# Patient Record
Sex: Male | Born: 1990 | Race: Black or African American | Hispanic: No | Marital: Single | Smoking: Never smoker
Health system: Southern US, Community
[De-identification: ages and names within clinical notes are randomized; demographics above are authoritative.]

## PROBLEM LIST (undated history)

## (undated) ENCOUNTER — Emergency Department (HOSPITAL_COMMUNITY): Payer: Self-pay | Source: Home / Self Care

## (undated) ENCOUNTER — Emergency Department (HOSPITAL_COMMUNITY): Admission: EM | Payer: Self-pay | Source: Home / Self Care

## (undated) DIAGNOSIS — J45909 Unspecified asthma, uncomplicated: Secondary | ICD-10-CM

## (undated) HISTORY — PX: VASECTOMY: SHX75

---
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
## 2018-01-04 ENCOUNTER — Encounter (HOSPITAL_COMMUNITY): Payer: Self-pay | Admitting: Nurse Practitioner

## 2018-01-04 ENCOUNTER — Emergency Department (HOSPITAL_COMMUNITY)
Admission: EM | Admit: 2018-01-04 | Discharge: 2018-01-04 | Disposition: A | Discharge status: Home / Self Care | Payer: Self-pay | Attending: Emergency Medicine | Admitting: Emergency Medicine

## 2018-01-04 ENCOUNTER — Emergency Department (HOSPITAL_COMMUNITY): Payer: Self-pay

## 2018-01-04 DIAGNOSIS — L03818 Cellulitis of other sites: Secondary | ICD-10-CM | POA: Insufficient documentation

## 2018-01-04 LAB — CBC WITH DIFFERENTIAL/PLATELET
ABS IMMATURE GRANULOCYTES: 0.04 10*3/uL (ref 0.00–0.07)
Basophils Absolute: 0 10*3/uL (ref 0.0–0.1)
Basophils Relative: 0 %
Eosinophils Absolute: 0.1 10*3/uL (ref 0.0–0.5)
Eosinophils Relative: 1 %
HCT: 42.7 % (ref 39.0–52.0)
Hemoglobin: 13.5 g/dL (ref 13.0–17.0)
Immature Granulocytes: 0 %
Lymphocytes Relative: 22 %
Lymphs Abs: 2.4 10*3/uL (ref 0.7–4.0)
MCH: 29.3 pg (ref 26.0–34.0)
MCHC: 31.6 g/dL (ref 30.0–36.0)
MCV: 92.8 fL (ref 80.0–100.0)
MONO ABS: 1 10*3/uL (ref 0.1–1.0)
Monocytes Relative: 9 %
Neutro Abs: 7.2 10*3/uL (ref 1.7–7.7)
Neutrophils Relative %: 68 %
Platelets: 247 10*3/uL (ref 150–400)
RBC: 4.6 MIL/uL (ref 4.22–5.81)
RDW: 13.9 % (ref 11.5–15.5)
WBC: 10.7 10*3/uL — AB (ref 4.0–10.5)
nRBC: 0 % (ref 0.0–0.2)

## 2018-01-04 LAB — BASIC METABOLIC PANEL
Anion gap: 8 (ref 5–15)
BUN: 21 mg/dL — ABNORMAL HIGH (ref 6–20)
CHLORIDE: 106 mmol/L (ref 98–111)
CO2: 27 mmol/L (ref 22–32)
CREATININE: 1.26 mg/dL — AB (ref 0.61–1.24)
Calcium: 9.1 mg/dL (ref 8.9–10.3)
GFR calc Af Amer: >60 mL/min (ref >60)
GFR calc non Af Amer: >60 mL/min (ref >60)
Glucose, Bld: 93 mg/dL (ref 70–99)
POTASSIUM: 3.9 mmol/L (ref 3.5–5.1)
Sodium: 141 mmol/L (ref 135–145)

## 2018-01-04 MED ORDER — OXYCODONE-ACETAMINOPHEN 5-325 MG PO TABS
1.0000 | ORAL_TABLET | Freq: Once | ORAL | Status: AC
  Administered 2018-01-04: 1 via ORAL
  Filled 2018-01-04: qty 1

## 2018-01-04 MED ORDER — SULFAMETHOXAZOLE-TRIMETHOPRIM 800-160 MG PO TABS: 1.0000 | ORAL_TABLET | Freq: Two times a day (BID) | ORAL | 0 refills | Status: AC

## 2018-01-04 MED ORDER — CEPHALEXIN 500 MG PO CAPS: 500.0000 mg | ORAL_CAPSULE | Freq: Four times a day (QID) | ORAL | 0 refills | Status: DC

## 2018-01-04 MED ORDER — CEPHALEXIN 500 MG PO CAPS
500.0000 mg | ORAL_CAPSULE | Freq: Once | ORAL | Status: AC
  Administered 2018-01-04: 500 mg via ORAL
  Filled 2018-01-04: qty 1

## 2018-01-04 MED ORDER — HYDROCODONE-ACETAMINOPHEN 5-325 MG PO TABS: 1.0000 | ORAL_TABLET | ORAL | 0 refills | Status: DC | PRN

## 2018-01-04 MED ORDER — SULFAMETHOXAZOLE-TRIMETHOPRIM 800-160 MG PO TABS
1.0000 | ORAL_TABLET | Freq: Once | ORAL | Status: AC
  Administered 2018-01-04: 1 via ORAL
  Filled 2018-01-04: qty 1

## 2018-01-04 NOTE — ED Notes (Signed)
US at bedside

## 2018-01-04 NOTE — ED Provider Notes (Signed)
[facility]   CSN: [id number] Arrival date & time: 01/04/18  0014     History   Chief Complaint Chief Complaint  Patient presents with  . Abscess    HPI Mario Harvey is a 27 y.o. male.  The history is provided by the patient and medical records.    27 y.o. M here with abscess of his genital region.  States he started noticing this a week ago, seems to be getting worse.  States mostly localized area below his scrotum, he denies any significant sternal pain or swelling.  He has not noticed any drainage or bleeding.  No difficulty urinating.  Denies any fevers or chills.  States he also has a "boil" in his right axilla.  He has a history of boils in his axilla in the past but no issues in his genital region before.  He has not tried any medications or interventions at home PTA.  History reviewed. No pertinent past medical history.  Patient Active Problem List   Diagnosis Date Noted  . ECZEMA, ATOPIC DERMATITIS 03/20/2006    History reviewed. No pertinent surgical history.      Home Medications    Prior to Admission medications   Not on File    Family History No family history on file.  Social History Social History   Tobacco Use  . Smoking status: Not on file  Substance Use Topics  . Alcohol use: Yes  . Drug use: Not on file     Allergies   Patient has no known allergies.   Review of Systems Review of Systems  Genitourinary:       Perineal abscess  All other systems reviewed and are negative.    Physical Exam Updated Vital Signs BP 132/86 (BP Location: Right Arm)   Pulse 84   Temp 98.9 F (37.2 C) (Oral)   Resp 14   SpO2 95%   Physical Exam Vitals signs and nursing note reviewed.  Constitutional:      Appearance: He is well-developed.  HENT:     Head: Normocephalic and atraumatic.  Eyes:     Conjunctiva/sclera: Conjunctivae normal.     Pupils: Pupils are equal, round, and reactive to  light.  Neck:     Musculoskeletal: Normal range of motion.  Cardiovascular:     Rate and Rhythm: Normal rate and regular rhythm.     Heart sounds: Normal heart sounds.  Pulmonary:     Effort: Pulmonary effort is normal.     Breath sounds: Normal breath sounds. No stridor. No rhonchi.  Abdominal:     General: Bowel sounds are normal.     Palpations: Abdomen is soft. There is no mass.     Hernia: No hernia is present.  Genitourinary:    Comments: No significant scrotal swelling/tenderness, no palpable mass; perineum is firm to the touch and tender; no fluctuance or abscess noted; there is no drainage/bleeding; no areas of skin necrosis appreciated, no tissue crepitus Musculoskeletal: Normal range of motion.  Skin:    General: Skin is warm and dry.  Neurological:     Mental Status: He is alert and oriented to person, place, and time.      ED Treatments / Results  Labs (all labs ordered are listed, but only abnormal results are displayed) Labs Reviewed - No data to display  EKG None  Radiology [place] Scrotum  Result Date: 01/04/2018 CLINICAL DATA:  Perineum swelling. EXAM: ULTRASOUND OF SCROTUM TECHNIQUE: Complete ultrasound examination  of the testicles, epididymis, and other scrotal structures was performed. COMPARISON:  None. FINDINGS: Right testicle Measurements: 4 x 1.9 x 3.5 cm. No mass or microlithiasis visualized. Left testicle Measurements: 3.5 x 2.1 x 3.6 cm. No mass or microlithiasis visualized. Right epididymis: Normal in size and appearance. 2.1 x 0.9 x 0.8 cm anechoic simple cyst with increased through transmission consistent with simple cyst. Left epididymis:  Normal in size and appearance. Hydrocele: Minimal LEFT hydrocele is likely physiologic. Varicocele:  None visualized. Dedicated sonogram of perineum does not demonstrate focal fluid or abnormal vascularity. IMPRESSION: 1. No acute process. 2. 2.1 cm RIGHT epididymal simple cyst. Electronically Signed   By: Awilda Metro M.D.   On: 01/04/2018 03:32    Procedures Procedures (including critical care time)  Medications Ordered in ED Medications - No data to display   Initial Impression / Assessment and Plan / ED Course  I have reviewed the triage vital signs and the nursing notes.  Pertinent labs & imaging results that were available during my care of the patient were reviewed by me and considered in my medical decision making (see chart for details).  27 year old male here with concern of "boil" in his genital region.  States he first noticed this about a week ago and seems like it is getting bigger.  He is afebrile and nontoxic in appearance.  He does not have any scrotal swelling or tenderness, no palpable mass.  His perineum along the right side is firm to the touch and tender but there is no appreciable fluctuance or abscess noted.  There is no area of skin necrosis, no tissue crepitus.  Given location, will obtain labs and ultrasound.    Labs are overall reassuring.  Ultrasound was done of the scrotum extending down into the perineum-- there is no abscess or other drainable fluid collection noted.  This likely represents cellulitis.  He does not have any signs or symptoms suggestive of Fournier's gangrene at this time.  Case discussed with attending physician, Dr. Lynelle Doctor-- recommends warm compresses/soaks, start bactrim/keflex.  Recommended close follow-up with urology for re-check.  Results and plan discussed with patient, he acknowledged understanding.  He will return here for any new/acute changes.  Final Clinical Impressions(s) / ED Diagnoses   Final diagnoses:  Cellulitis of other specified site    ED Discharge Orders         Ordered    sulfamethoxazole-trimethoprim (BACTRIM DS,SEPTRA DS) 800-160 MG tablet  2 times daily     01/04/18 0545    cephALEXin (KEFLEX) 500 MG capsule  4 times daily     01/04/18 0545    HYDROcodone-acetaminophen (NORCO/VICODIN) 5-325 MG tablet  Every 4 hours PRN      01/04/18 0545           Garlon Hatchet, PA-C 01/04/18 4098    Devoria Albe, MD 01/04/18 [phone]

## 2018-01-04 NOTE — Discharge Instructions (Signed)
Take the prescribed medication as directed.  Recommend warm compresses to the genital area. Follow-up with urology-- call for appt. Return to the ED for new or worsening symptoms-- worsening pain, high fever, drainage, testicular swelling, etc.

## 2018-01-04 NOTE — ED Triage Notes (Signed)
Pt is reporting an abscess/boil in his testicular area. Denies fever or chills.

## 2018-02-11 ENCOUNTER — Encounter (HOSPITAL_COMMUNITY): Payer: Self-pay | Admitting: Emergency Medicine

## 2018-02-11 ENCOUNTER — Ambulatory Visit (HOSPITAL_COMMUNITY)
Admission: EM | Admit: 2018-02-11 | Discharge: 2018-02-11 | Disposition: A | Discharge status: Home / Self Care | Payer: Self-pay | Attending: Family Medicine | Admitting: Family Medicine

## 2018-02-11 DIAGNOSIS — L03115 Cellulitis of right lower limb: Secondary | ICD-10-CM

## 2018-02-11 DIAGNOSIS — L02415 Cutaneous abscess of right lower limb: Secondary | ICD-10-CM

## 2018-02-11 DIAGNOSIS — L988 Other specified disorders of the skin and subcutaneous tissue: Secondary | ICD-10-CM

## 2018-02-11 LAB — GLUCOSE, CAPILLARY: GLUCOSE-CAPILLARY: 92 mg/dL (ref 70–99)

## 2018-02-11 MED ORDER — CEFTRIAXONE SODIUM 1 G IJ SOLR
INTRAMUSCULAR | Status: AC
  Filled 2018-02-11: qty 10

## 2018-02-11 MED ORDER — HYDROCODONE-ACETAMINOPHEN 5-325 MG PO TABS: 1.0000 | ORAL_TABLET | Freq: Four times a day (QID) | ORAL | 0 refills | Status: DC | PRN

## 2018-02-11 MED ORDER — SULFAMETHOXAZOLE-TRIMETHOPRIM 800-160 MG PO TABS: 1.0000 | ORAL_TABLET | Freq: Two times a day (BID) | ORAL | 0 refills | Status: AC

## 2018-02-11 MED ORDER — CEFTRIAXONE SODIUM 1 G IJ SOLR
1.0000 g | Freq: Once | INTRAMUSCULAR | Status: AC
  Administered 2018-02-11: 1 g via INTRAMUSCULAR

## 2018-02-11 NOTE — Discharge Instructions (Addendum)
You have had an abscess drained today. You may shower daily. Let the soapy water clean your wound. Do not scrub it. Keep your wound covered. Follow up at the Urgent Care in 2 days for a wound check. Return to the Urgent Care immediately if you develop any of the following symptoms: fever, Increased redness or swelling around where your abscess was, increased pain, or generalized weakness or vomiting.  Be aware, pain medications may cause drowsiness. Please do not drive, operate heavy machinery or make important decisions while on this medication, it can cloud your judgement.  You were given the following medication here this evening: Meds ordered this encounter  Medications   cefTRIAXone (ROCEPHIN) injection 1 g

## 2018-02-11 NOTE — ED Provider Notes (Signed)
[facility]   [id number] 02/11/18 Arrival Time: 1517  ASSESSMENT & PLAN:  1. Abscess of knee, right   2. Cellulitis of leg, right     Incision and Drainage Procedure Note  Anesthesia: 1% lidocaine with epinephrine Procedure Details  The procedure, risks and complications have been discussed in detail (including, but not limited to pain and bleeding) with the patient.  The skin induration was prepped and draped in the usual fashion. After adequate local anesthesia, I&D with a #11 blade was performed on the right anterior knee with moderate purlulent drainage.  EBL: minimal Drains: none Packing: none Condition: Tolerated procedure well Complications: none.  Meds ordered this encounter  Medications  . cefTRIAXone (ROCEPHIN) injection 1 g  . sulfamethoxazole-trimethoprim (BACTRIM DS,SEPTRA DS) 800-160 MG tablet    Sig: Take 1 tablet by mouth 2 (two) times daily for 10 days.    Dispense:  20 tablet    Refill:  0  . HYDROcodone-acetaminophen (NORCO/VICODIN) 5-325 MG tablet    Sig: Take 1 tablet by mouth every 6 (six) hours as needed for moderate pain or severe pain.    Dispense:  8 tablet    Refill:  0    Wound care instructions discussed and given in written format. To return in 48 hours for wound check unless he feels the erythema from cellulitis is rapidly spreading in which case he understands to proceed to the ED for evaluation.  [place] Controlled Substances Registry consulted for this patient. I feel the risk/benefit ratio today is favorable for proceeding with this prescription for a controlled substance. Medication sedation precautions given.  Finish all antibiotics. OTC analgesics as needed.  Reviewed expectations re: course of current medical issues. Questions answered. Outlined signs and symptoms indicating need for more acute intervention. Patient verbalized understanding. After Visit Summary given.   SUBJECTIVE: History from: patient. Mario Harvey is  a 28 y.o. male who presents with a possible infection of his R knee and leg. Onset gradual, approximately several days ago with mild drainage today and without active bleeding. Symptoms have gradually worsened since beginning. Increasing erythema around his knee spreading proximally and distally. Fever: questions subjective with chills last night. Kneels a lot at work. Area around knee started out "as a small bump"; has been picking at it on and off. OTC/home treatment: none reported.  ROS: As per HPI.  OBJECTIVE:  Vitals:   02/11/18 1628  BP: 137/60  Pulse: (!) 106  Resp: 18  Temp: 98.3 F (36.8 C)  TempSrc: Oral  SpO2: 96%     General appearance: alert; no distress Skin: 1.5 cm induration of his R anterior knee; tender to touch; no active drainage or bleeding; significant erythema spreading proximally to mid thigh and distally to mid calf; warm to touch RLE: no calf swelling; 2+ DP and PT pulses; FROM of R knee and R ankle Psychological: alert and cooperative; normal mood and affect  No Known Allergies  PMH: Similar skin infection approx one month ago. Went away without treatment.  Social History   Socioeconomic History  . Marital status: Single    Spouse name: Not on file  . Number of children: Not on file  . Years of education: Not on file  . Highest education level: Not on file  Occupational History  . Not on file  Social Needs  . Financial resource strain: Not on file  . Food insecurity:    Worry: Not on file    Inability: Not on file  .  Transportation needs:    Medical: Not on file    Non-medical: Not on file  Tobacco Use  . Smoking status: Never Smoker  . Smokeless tobacco: Never Used  Substance and Sexual Activity  . Alcohol use: Yes  . Drug use: Not on file  . Sexual activity: Not on file  Lifestyle  . Physical activity:    Days per week: Not on file    Minutes per session: Not on file  . Stress: Not on file  Relationships  . Social connections:     Talks on phone: Not on file    Gets together: Not on file    Attends religious service: Not on file    Active member of club or organization: Not on file    Attends meetings of clubs or organizations: Not on file    Relationship status: Not on file  Other Topics Concern  . Not on file  Social History Narrative  . Not on file    History reviewed. No pertinent surgical history.         Mardella Layman, MD 02/12/18 [phone]

## 2018-02-11 NOTE — ED Triage Notes (Signed)
Pt presents to [facility] for assessment of right knee pain, scraped his leg on something 2-3 weeks ago, and now notes discharge from area

## 2018-05-11 ENCOUNTER — Ambulatory Visit (HOSPITAL_COMMUNITY)
Admission: EM | Admit: 2018-05-11 | Discharge: 2018-05-11 | Disposition: A | Discharge status: Home / Self Care | Payer: Self-pay | Attending: Family Medicine | Admitting: Family Medicine

## 2018-05-11 ENCOUNTER — Other Ambulatory Visit: Payer: Self-pay

## 2018-05-11 ENCOUNTER — Encounter (HOSPITAL_COMMUNITY): Payer: Self-pay | Admitting: Emergency Medicine

## 2018-05-11 DIAGNOSIS — L0231 Cutaneous abscess of buttock: Secondary | ICD-10-CM

## 2018-05-11 MED ORDER — CEPHALEXIN 500 MG PO CAPS: 500.0000 mg | ORAL_CAPSULE | Freq: Four times a day (QID) | ORAL | 0 refills | Status: DC

## 2018-05-11 MED ORDER — MUPIROCIN 2 % EX OINT: 1.0000 "application " | TOPICAL_OINTMENT | Freq: Two times a day (BID) | CUTANEOUS | 0 refills | Status: DC

## 2018-05-11 NOTE — ED Notes (Signed)
Patient verbalizes understanding of discharge instructions. Opportunity for questioning and answers were provided. Patient discharged from [facility] by RN.  

## 2018-05-11 NOTE — ED Provider Notes (Signed)
[facility]    CSN: [id number] Arrival date & time: 05/11/18  1252     History   Chief Complaint Chief Complaint  Patient presents with  . Abscess    HPI Mario Harvey is a 28 y.o. male.   28 year old male comes in for abscess to the right buttock x 3 days. States has been squeezing area without obvious relief. Denies increase in size, spreading erythema, warmth, fever. States pain mostly with sitting due to location of abscess, but if without pressure to the abscess, no pain. Has not taken anything for the symptoms.      History reviewed. No pertinent past medical history.  Patient Active Problem List   Diagnosis Date Noted  . ECZEMA, ATOPIC DERMATITIS 03/20/2006    History reviewed. No pertinent surgical history.     Home Medications    Prior to Admission medications   Medication Sig Start Date End Date Taking? Authorizing Provider  acetaminophen (TYLENOL) 500 MG tablet Take 1,000 mg by mouth every 6 (six) hours as needed for mild pain, moderate pain or headache.    [provider]  cephALEXin (KEFLEX) 500 MG capsule Take 1 capsule (500 mg total) by mouth 4 (four) times daily. 05/11/18   Belinda Fisher, PA-C  HYDROcodone-acetaminophen (NORCO/VICODIN) 5-325 MG tablet Take 1 tablet by mouth every 6 (six) hours as needed for moderate pain or severe pain. Patient not taking: Reported on 05/11/2018 02/11/18   Mardella Layman, MD  mupirocin ointment (BACTROBAN) 2 % Apply 1 application topically 2 (two) times daily. 05/11/18   Belinda Fisher, PA-C    Family History No family history on file.  Social History Social History   Tobacco Use  . Smoking status: Never Smoker  . Smokeless tobacco: Never Used  Substance Use Topics  . Alcohol use: Yes  . Drug use: Not on file     Allergies   Patient has no known allergies.   Review of Systems Review of Systems  Reason unable to perform ROS: See HPI as above.     Physical Exam Triage Vital Signs ED Triage  Vitals  Enc Vitals Group     BP 05/11/18 1300 114/73     Pulse Rate 05/11/18 1300 69     Resp 05/11/18 1300 16     Temp 05/11/18 1300 98.2 F (36.8 C)     Temp src --      SpO2 05/11/18 1300 100 %     Weight --      Height --      Head Circumference --      Peak Flow --      Pain Score 05/11/18 1303 6     Pain Loc --      Pain Edu? --      Excl. in GC? --    No data found.  Updated Vital Signs BP 114/73   Pulse 69   Temp 98.2 F (36.8 C)   Resp 16   SpO2 100%   Physical Exam Constitutional:      General: He is not in acute distress.    Appearance: He is well-developed. He is not diaphoretic.  HENT:     Head: Normocephalic and atraumatic.  Eyes:     Conjunctiva/sclera: Conjunctivae normal.     Pupils: Pupils are equal, round, and reactive to light.  Skin:    Comments: 1cm x 0.5cm abscess to the right inferior buttock, adjacent to the thigh. Surrounding induration with mild warmth  and erythema.   Neurological:     Mental Status: He is alert and oriented to person, place, and time.      UC Treatments / Results  Labs (all labs ordered are listed, but only abnormal results are displayed) Labs Reviewed - No data to display  EKG None  Radiology No results found.  Procedures Incision and Drainage Date/Time: 05/11/2018 1:44 PM Performed by: Belinda Fisher,  V, PA-C Authorized by: Myra RudeSchmitz, Jeremy E, MD   Consent:    Consent obtained:  Verbal   Consent given by:  Patient   Risks discussed:  Bleeding, incomplete drainage, pain, damage to other organs and infection   Alternatives discussed:  Alternative treatment Location:    Type:  Abscess   Size:  1cm x 0.5cm   Location:  Lower extremity   Lower extremity location:  Buttock   Buttock location:  R buttock Pre-procedure details:    Skin preparation:  Chloraprep Anesthesia (see MAR for exact dosages):    Anesthesia method:  Local infiltration   Local anesthetic:  Lidocaine 2% w/o epi Procedure type:     Complexity:  Simple Procedure details:    Needle aspiration: no     Incision types:  Single straight   Incision depth:  Dermal   Scalpel blade:  11   Wound management:  Irrigated with saline   Drainage:  Purulent   Drainage amount:  Moderate   Wound treatment:  Wound left open   Packing materials:  None Post-procedure details:    Patient tolerance of procedure:  Tolerated well, no immediate complications   (including critical care time)  Medications Ordered in UC Medications - No data to display  Initial Impression / Assessment and Plan / UC Course  I have reviewed the triage vital signs and the nursing notes.  Pertinent labs & imaging results that were available during my care of the patient were reviewed by me and considered in my medical decision making (see chart for details).    Patient tolerated procedure well. Start keflex for surrounding cellulitis. Wound care instructions given. Return precautions given. Patient expresses understanding and agrees to plan.   Final Clinical Impressions(s) / UC Diagnoses   Final diagnoses:  Abscess of buttock, right    ED Prescriptions    Medication Sig Dispense Auth. Provider   mupirocin ointment (BACTROBAN) 2 % Apply 1 application topically 2 (two) times daily. 22 g ,  V, PA-C   cephALEXin (KEFLEX) 500 MG capsule Take 1 capsule (500 mg total) by mouth 4 (four) times daily. 28 capsule Threasa Alpha,  V, PA-C        ,  V, [place]PA-C 05/11/18 1346

## 2018-05-11 NOTE — ED Triage Notes (Signed)
Pt states he has an abscess or boil on his buttocks x3 days.

## 2018-05-11 NOTE — Discharge Instructions (Signed)
Start keflex as directed. You can remove current dressing in 24 hours. Keep wound clean and dry. You can clean gently with soap and water. Do not soak area in water. Dress wound with bactroban ointment. Warm compress to the area. Monitor for spreading redness, increased warmth, increased swelling, fever, follow up for reevaluation needed.

## 2018-12-18 ENCOUNTER — Encounter (HOSPITAL_COMMUNITY): Payer: Self-pay | Admitting: *Deleted

## 2018-12-18 ENCOUNTER — Other Ambulatory Visit: Payer: Self-pay

## 2018-12-18 ENCOUNTER — Ambulatory Visit (HOSPITAL_COMMUNITY)
Admission: EM | Admit: 2018-12-18 | Discharge: 2018-12-18 | Disposition: A | Discharge status: Home / Self Care | Payer: Self-pay | Attending: Family Medicine | Admitting: Family Medicine

## 2018-12-18 DIAGNOSIS — Z8619 Personal history of other infectious and parasitic diseases: Secondary | ICD-10-CM

## 2018-12-18 DIAGNOSIS — Z202 Contact with and (suspected) exposure to infections with a predominantly sexual mode of transmission: Secondary | ICD-10-CM | POA: Insufficient documentation

## 2018-12-18 MED ORDER — AZITHROMYCIN 250 MG PO TABS
1000.0000 mg | ORAL_TABLET | Freq: Once | ORAL | Status: AC
  Administered 2018-12-18: 16:00:00 1000 mg via ORAL

## 2018-12-18 MED ORDER — AZITHROMYCIN 250 MG PO TABS
ORAL_TABLET | ORAL | Status: AC
  Filled 2018-12-18: qty 4

## 2018-12-18 NOTE — ED Triage Notes (Addendum)
Patient reports being exposed to chlamydia, no symptoms. Patient would like treatment.

## 2018-12-18 NOTE — Discharge Instructions (Signed)
We have treated you today for chlamydia  Will notify of any positive findings from your penile swab and if any changes to treatment are needed.   Please withhold from intercourse for the next week. Please use condoms to prevent STD's.

## 2018-12-18 NOTE — ED Provider Notes (Signed)
[facility]    CSN: [id number] Arrival date & time: 12/18/18  1514      History   Chief Complaint Chief Complaint  Patient presents with  . Exposure to STD    HPI Mario Harvey is a 28 y.o. male.   Mario Harvey presents with complaints of exposure to chlamydia. His partner notified him that she tested positive for chlamydia this week. He denies  Any symptoms. Denies any dysuria, penile discharge, pelvic pain, urinary symptoms, back pain, sores, lesions, redness or swelling. States he has had chlamydia in the past.     ROS per HPI, negative if not otherwise mentioned.      History reviewed. No pertinent past medical history.  Patient Active Problem List   Diagnosis Date Noted  . ECZEMA, ATOPIC DERMATITIS 03/20/2006    History reviewed. No pertinent surgical history.     Home Medications    Prior to Admission medications   Medication Sig Start Date End Date Taking? Authorizing Provider  acetaminophen (TYLENOL) 500 MG tablet Take 1,000 mg by mouth every 6 (six) hours as needed for mild pain, moderate pain or headache.    [provider]  cephALEXin (KEFLEX) 500 MG capsule Take 1 capsule (500 mg total) by mouth 4 (four) times daily. 05/11/18   Belinda Fisher, PA-C  HYDROcodone-acetaminophen (NORCO/VICODIN) 5-325 MG tablet Take 1 tablet by mouth every 6 (six) hours as needed for moderate pain or severe pain. Patient not taking: Reported on 05/11/2018 02/11/18   Mardella Layman, MD  mupirocin ointment (BACTROBAN) 2 % Apply 1 application topically 2 (two) times daily. 05/11/18   Belinda Fisher, PA-C    Family History Family History  Problem Relation Age of Onset  . Diabetes Father     Social History Social History   Tobacco Use  . Smoking status: Never Smoker  . Smokeless tobacco: Never Used  Substance Use Topics  . Alcohol use: Yes  . Drug use: Not on file     Allergies   Patient has no known allergies.   Review of Systems Review of Systems   Physical Exam Triage Vital Signs ED Triage Vitals  Enc Vitals Group     BP 12/18/18 1545 121/78     Pulse Rate 12/18/18 1545 (!) 58     Resp 12/18/18 1545 17     Temp 12/18/18 1545 98.1 F (36.7 C)     Temp Source 12/18/18 1545 Oral     SpO2 12/18/18 1545 98 %     Weight --      Height --      Head Circumference --      Peak Flow --      Pain Score 12/18/18 1544 0     Pain Loc --      Pain Edu? --      Excl. in GC? --    No data found.  Updated Vital Signs BP 121/78 (BP Location: Right Arm)   Pulse (!) 58   Temp 98.1 F (36.7 C) (Oral)   Resp 17   SpO2 98%   Physical Exam Constitutional:      Appearance: He is well-developed.  Cardiovascular:     Rate and Rhythm: Normal rate.  Pulmonary:     Effort: Pulmonary effort is normal.  Abdominal:     Palpations: Abdomen is soft. Abdomen is not rigid.     Tenderness: There is no abdominal tenderness. There is no guarding or rebound. Negative signs include Murphy's sign  and McBurney's sign.     Comments: Denies scrotal redness, swelling, pain; denies sores or lesions; gu exam deferred   Skin:    General: Skin is warm and dry.  Neurological:     Mental Status: He is alert and oriented to person, place, and time.      UC Treatments / Results  Labs (all labs ordered are listed, but only abnormal results are displayed) Labs Reviewed  CYTOLOGY, (ORAL, ANAL, URETHRAL) ANCILLARY ONLY    EKG   Radiology No results found.  Procedures Procedures (including critical care time)  Medications Ordered in UC Medications  azithromycin (ZITHROMAX) tablet 1,000 mg (1,000 mg Oral Given 12/18/18 1600)  azithromycin (ZITHROMAX) 250 MG tablet (has no administration in time range)    Initial Impression / Assessment and Plan / UC Course  I have reviewed the triage vital signs and the nursing notes.  Pertinent labs & imaging results that were available during my care of the patient were reviewed by me and considered in my  medical decision making (see chart for details).     Empiric chlamydia treatment provided with penile cytology collected and pending. Safe sex encouraged. Return precautions provided. Patient verbalized understanding and agreeable to plan.   Final Clinical Impressions(s) / UC Diagnoses   Final diagnoses:  Exposure to chlamydia     Discharge Instructions     We have treated you today for chlamydia  Will notify of any positive findings from your penile swab and if any changes to treatment are needed.   Please withhold from intercourse for the next week. Please use condoms to prevent STD's.     ED Prescriptions    None     PDMP not reviewed this encounter.   Zigmund Gottron, NP 12/18/18 1711

## 2018-12-21 ENCOUNTER — Telehealth (HOSPITAL_COMMUNITY): Payer: Self-pay | Admitting: Emergency Medicine

## 2018-12-21 LAB — CYTOLOGY, (ORAL, ANAL, URETHRAL) ANCILLARY ONLY
Chlamydia: POSITIVE — AB
Neisseria Gonorrhea: NEGATIVE
Trichomonas: NEGATIVE

## 2018-12-21 NOTE — Telephone Encounter (Signed)
Chlamydia is positive.  This was treated at the urgent care visit with po zithromax 1g.  Pt needs education to please refrain from sexual intercourse for 7 days to give the medicine time to work.  Sexual partners need to be notified and tested/treated.  Condoms may reduce risk of reinfection.  Recheck or followup with PCP for further evaluation if symptoms are not improving.  [facility] notified.  Patient contacted by phone and made aware of    results. Pt verbalized understanding and had all questions answered.    

## 2019-04-07 IMAGING — US US SCROTUM
1 series · 14 of 25 positions shown · non-contrast
Comparison: None.

CLINICAL DATA: Perineum swelling.

EXAM:
ULTRASOUND OF SCROTUM
TECHNIQUE: Complete ultrasound examination of the testicles, epididymis, and
other scrotal structures was performed.

[Series 1: us scrotum · 0.08mm/px · 14 of 78 slices shown]
[im 1/78]
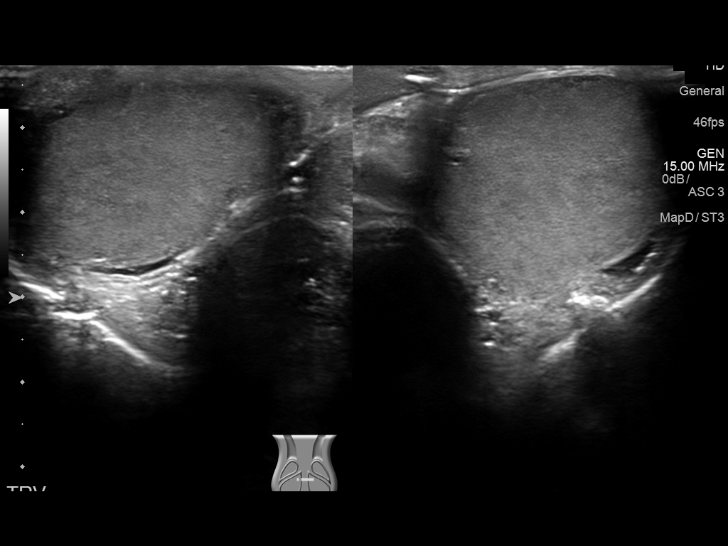
[im 7/78]
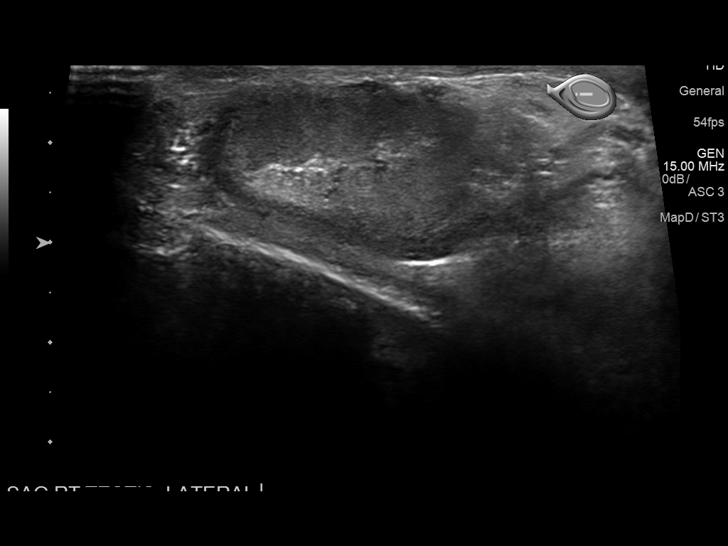
[im 13/78]
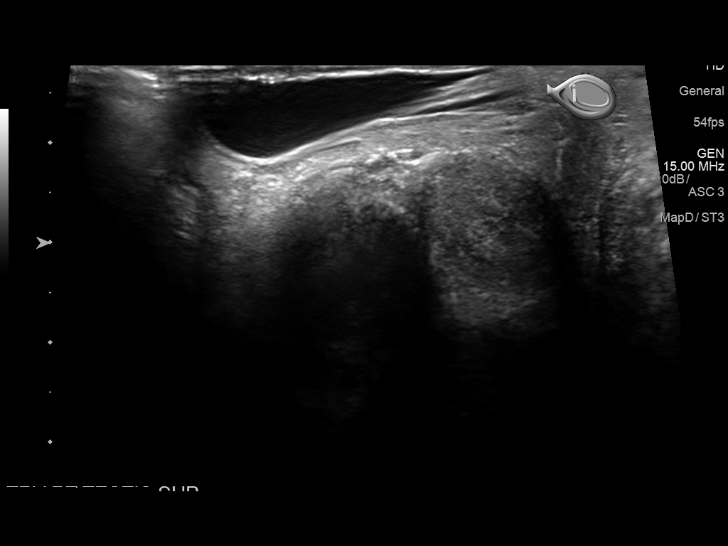
[im 20/78]
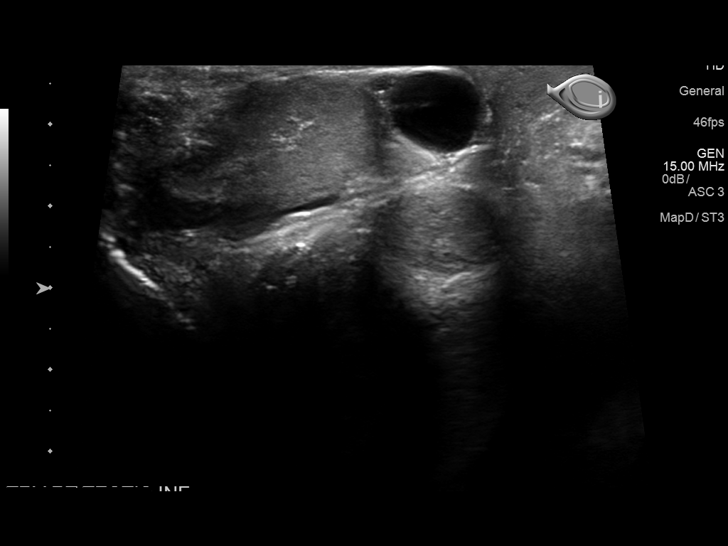
[im 26/78]
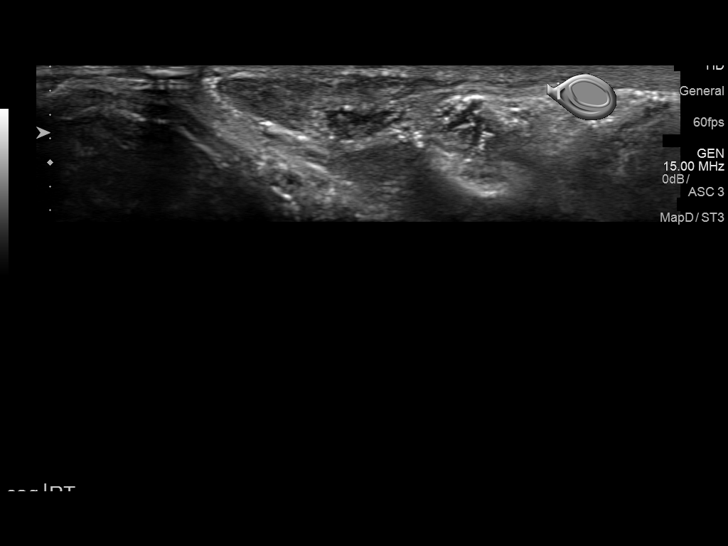
[im 29/78]
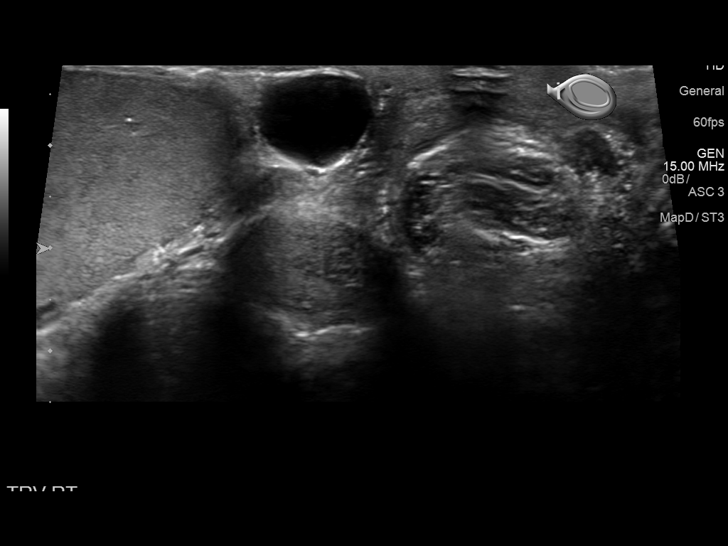
[im 36/78]
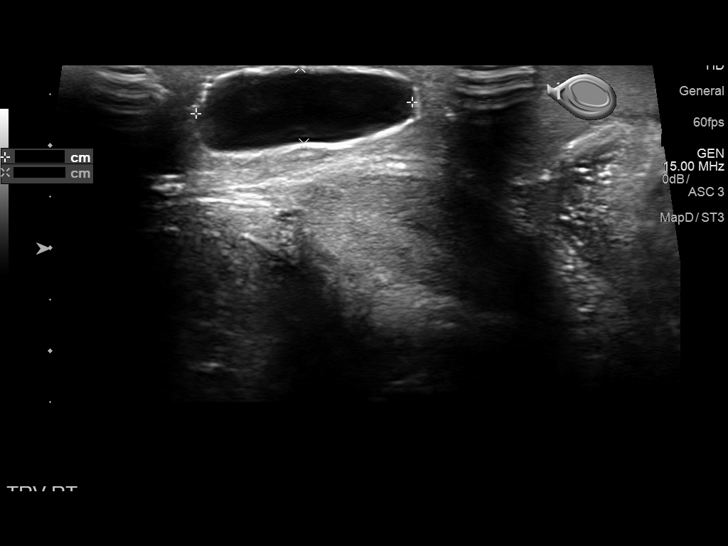
[im 42/78]
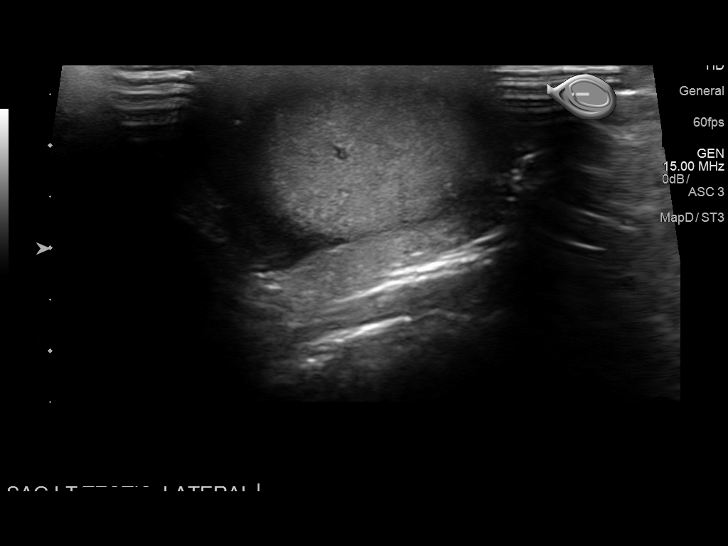
[im 49/78]
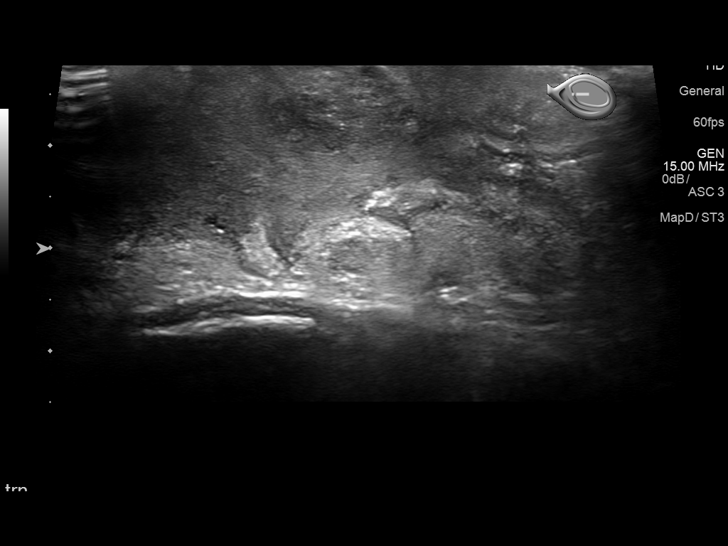
[im 52/78]
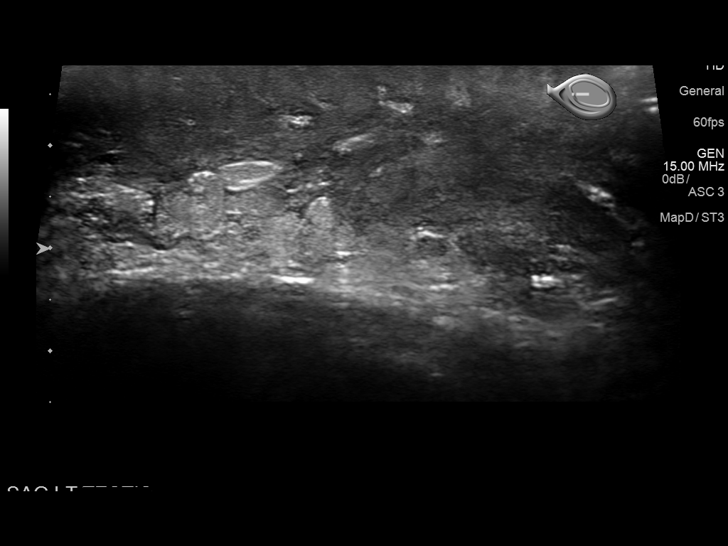
[im 58/78]
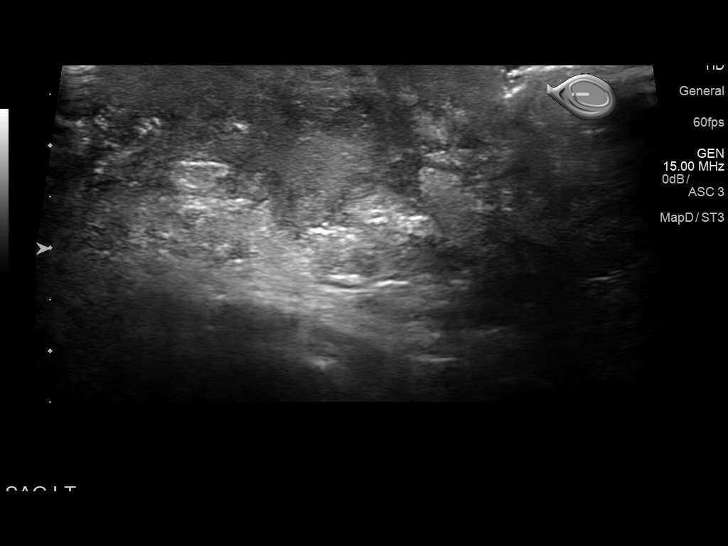
[im 65/78]
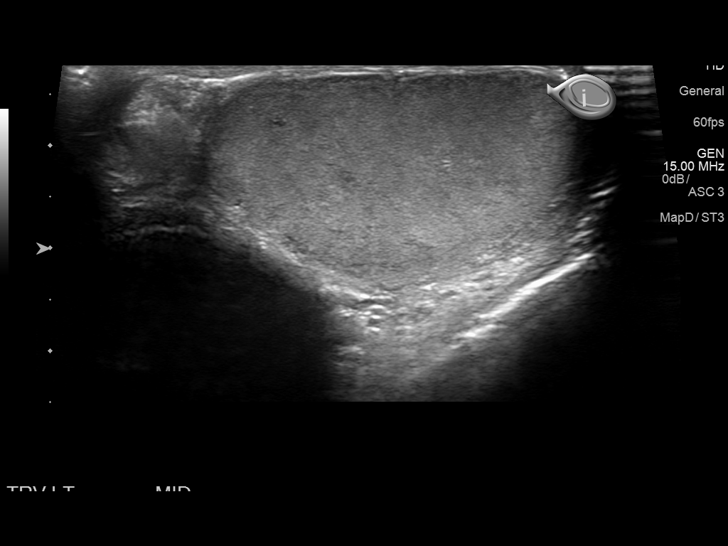
[im 71/78]
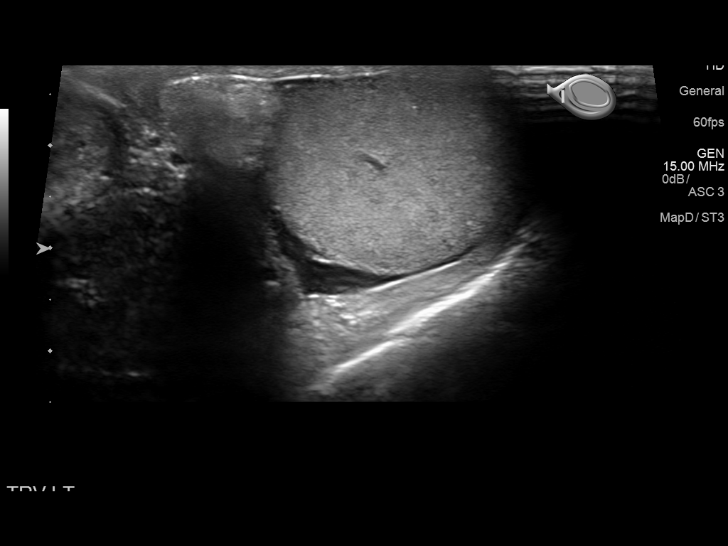
[im 78/78]
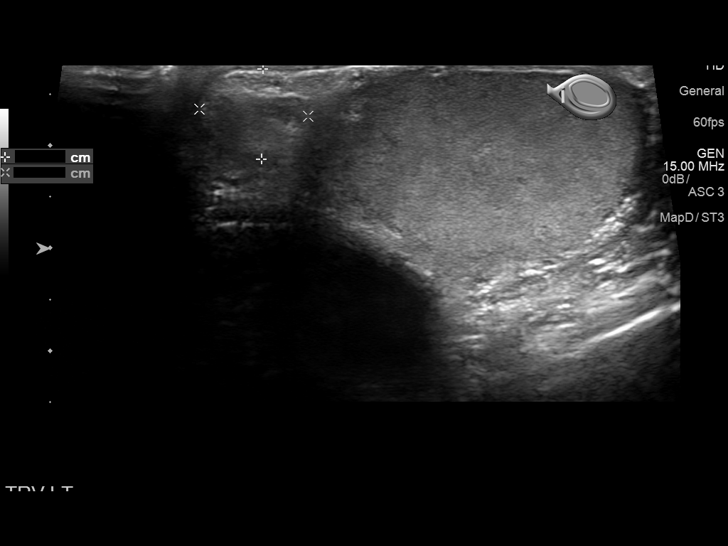

[14 of 25 positions shown; findings below may reference images not displayed]

FINDINGS: Right testicle

Measurements: 4 x 1.9 x 3.5 cm. No mass or microlithiasis
visualized.

Left testicle

Measurements: 3.5 x 2.1 x 3.6 cm. No mass or microlithiasis
visualized.

Right epididymis: Normal in size and appearance. 2.1 x 0.9 x 0.8 cm
anechoic simple cyst with increased through transmission consistent
with simple cyst.

Left epididymis:  Normal in size and appearance.

Hydrocele: Minimal LEFT hydrocele is likely physiologic.

Varicocele:  None visualized.

Dedicated sonogram of perineum does not demonstrate focal fluid or
abnormal vascularity.
IMPRESSION: 1. No acute process.
2. 2.1 cm RIGHT epididymal simple cyst.

## 2019-05-29 ENCOUNTER — Ambulatory Visit (HOSPITAL_COMMUNITY)
Admission: EM | Admit: 2019-05-29 | Discharge: 2019-05-29 | Disposition: A | Discharge status: Home / Self Care | Payer: Self-pay | Attending: Urgent Care | Admitting: Urgent Care

## 2019-05-29 ENCOUNTER — Encounter (HOSPITAL_COMMUNITY): Payer: Self-pay

## 2019-05-29 DIAGNOSIS — Z202 Contact with and (suspected) exposure to infections with a predominantly sexual mode of transmission: Secondary | ICD-10-CM | POA: Insufficient documentation

## 2019-05-29 HISTORY — DX: Unspecified asthma, uncomplicated: J45.909

## 2019-05-29 MED ORDER — CEFTRIAXONE SODIUM 500 MG IJ SOLR
INTRAMUSCULAR | Status: AC
  Filled 2019-05-29: qty 500

## 2019-05-29 MED ORDER — LIDOCAINE HCL (PF) 1 % IJ SOLN
INTRAMUSCULAR | Status: AC
  Filled 2019-05-29: qty 2

## 2019-05-29 MED ORDER — CEFTRIAXONE SODIUM 500 MG IJ SOLR
500.0000 mg | Freq: Once | INTRAMUSCULAR | Status: AC
  Administered 2019-05-29: 12:00:00 500 mg via INTRAMUSCULAR

## 2019-05-29 MED ORDER — AZITHROMYCIN 250 MG PO TABS
ORAL_TABLET | ORAL | Status: AC
  Filled 2019-05-29: qty 4

## 2019-05-29 MED ORDER — AZITHROMYCIN 250 MG PO TABS
1000.0000 mg | ORAL_TABLET | Freq: Once | ORAL | Status: AC
  Administered 2019-05-29: 12:00:00 1000 mg via ORAL

## 2019-05-29 NOTE — ED Provider Notes (Signed)
  [facility]   MRN: [id number] DOB: 12/17/1990  Subjective:   Mario Harvey is a 29 y.o. male presenting for treatment for gonorrhea and chlamydia. His girlfriend tested positive for this. Denies dysuria, hematuria, urinary frequency, penile discharge, penile swelling, testicular pain, testicular swelling, anal pain, groin pain.  Denies taking chronic medications.    No Known Allergies  Past Medical History:  Diagnosis Date  . Asthma      Past Surgical History:  Procedure Laterality Date  . VASECTOMY      Family History  Problem Relation Age of Onset  . Diabetes Father     Social History   Tobacco Use  . Smoking status: Never Smoker  . Smokeless tobacco: Never Used  Substance Use Topics  . Alcohol use: Yes    Alcohol/week: 2.0 standard drinks    Types: 2 Glasses of wine per week  . Drug use: Never    ROS   Objective:   Vitals: Pulse 95   Temp 98.9 F (37.2 C) (Oral)   Resp 16   Ht 5\' 7"  (1.702 m)   Wt 160 lb (72.6 kg)   SpO2 99%   BMI 25.06 kg/m   Physical Exam Constitutional:      General: He is not in acute distress.    Appearance: Normal appearance. He is well-developed and normal weight. He is not ill-appearing, toxic-appearing or diaphoretic.  HENT:     Head: Normocephalic and atraumatic.     Right Ear: External ear normal.     Left Ear: External ear normal.     Nose: Nose normal.     Mouth/Throat:     Pharynx: Oropharynx is clear.  Eyes:     General: No scleral icterus.       Right eye: No discharge.        Left eye: No discharge.     Extraocular Movements: Extraocular movements intact.     Pupils: Pupils are equal, round, and reactive to light.  Cardiovascular:     Rate and Rhythm: Normal rate.  Pulmonary:     Effort: Pulmonary effort is normal.  Genitourinary:    Penis: Circumcised. No phimosis, paraphimosis, hypospadias, erythema, tenderness, discharge, swelling or lesions.   Musculoskeletal:     Cervical back: Normal  range of motion.  Skin:    General: Skin is warm and dry.  Neurological:     Mental Status: He is alert and oriented to person, place, and time.  Psychiatric:        Mood and Affect: Mood normal.        Behavior: Behavior normal.        Thought Content: Thought content normal.        Judgment: Judgment normal.      Assessment and Plan :   PDMP not reviewed this encounter.  1. STD exposure   2. Gonorrhea contact, untreated   3. Chlamydia contact, untreated     Patient treated empirically as per CDC guidelines with IM ceftriaxone, 1g azithromycin. Patient did not want to take medication as an outpatient, refused doxycycline.  Labs pending.   Counseled on safe sex practices including abstaining for 1 week following treatment.  Counseled patient on potential for adverse effects with medications prescribed/recommended today, ER and return-to-clinic precautions discussed, patient verbalized understanding.    , PA-C 05/29/19 1139

## 2019-05-29 NOTE — Discharge Instructions (Addendum)
Avoid all forms of sexual intercourse (oral, vaginal, anal) for the next 7 days to avoid spreading/reinfecting. Return if symptoms worsen/do not resolve, you develop fever, abdominal pain, blood in your urine, or are re-exposed to an STI.  

## 2019-05-29 NOTE — ED Triage Notes (Signed)
Pt's girlfriend informed him she tested + for gonorrhea and chlamydia and he needs tx.

## 2019-05-31 LAB — CYTOLOGY, (ORAL, ANAL, URETHRAL) ANCILLARY ONLY
Chlamydia: NEGATIVE
Comment: NEGATIVE
Comment: NEGATIVE
Comment: NORMAL
Neisseria Gonorrhea: NEGATIVE
Trichomonas: NEGATIVE

## 2019-06-13 ENCOUNTER — Other Ambulatory Visit: Payer: Self-pay

## 2019-06-13 ENCOUNTER — Emergency Department (HOSPITAL_COMMUNITY)
Admission: EM | Admit: 2019-06-13 | Discharge: 2019-06-13 | Disposition: A | Discharge status: Home / Self Care | Payer: Self-pay | Attending: Emergency Medicine | Admitting: Emergency Medicine

## 2019-06-13 ENCOUNTER — Encounter (HOSPITAL_COMMUNITY): Payer: Self-pay | Admitting: Emergency Medicine

## 2019-06-13 ENCOUNTER — Emergency Department (HOSPITAL_COMMUNITY): Payer: Self-pay

## 2019-06-13 DIAGNOSIS — Y929 Unspecified place or not applicable: Secondary | ICD-10-CM | POA: Insufficient documentation

## 2019-06-13 DIAGNOSIS — J45909 Unspecified asthma, uncomplicated: Secondary | ICD-10-CM | POA: Insufficient documentation

## 2019-06-13 DIAGNOSIS — W228XXA Striking against or struck by other objects, initial encounter: Secondary | ICD-10-CM | POA: Insufficient documentation

## 2019-06-13 DIAGNOSIS — Y999 Unspecified external cause status: Secondary | ICD-10-CM | POA: Insufficient documentation

## 2019-06-13 DIAGNOSIS — S6391XA Sprain of unspecified part of right wrist and hand, initial encounter: Secondary | ICD-10-CM

## 2019-06-13 DIAGNOSIS — Y939 Activity, unspecified: Secondary | ICD-10-CM | POA: Insufficient documentation

## 2019-06-13 NOTE — Progress Notes (Signed)
Orthopedic Tech Progress Note Patient Details:  Mario Harvey April 21, 1990 [id number]  Ortho Devices Type of Ortho Device: Ace wrap Ortho Device/Splint Location: URE Ortho Device/Splint Interventions: Application, Ordered   Post Interventions Patient Tolerated: Well Instructions Provided: Care of device  Instructed by PA to ace wrap patients fingers and wrist. Bella Kennedy A Tye 06/13/2019, 10:28 AM

## 2019-06-13 NOTE — ED Notes (Signed)
Ortho tech paged  

## 2019-06-13 NOTE — Progress Notes (Signed)
Orthopedic Tech Progress Note Patient Details:  Mario Harvey 1990/09/04 [id number]  Ortho Devices Type of Ortho Device: Thumb velcro splint Ortho Device/Splint Location: URE Ortho Device/Splint Interventions: Application, Ordered   Post Interventions Patient Tolerated: Well Instructions Provided: Care of device   Mario Harvey A Marietta Sikkema 06/13/2019, 9:41 AM

## 2019-06-13 NOTE — ED Triage Notes (Signed)
C/o R hand pain and swelling since hitting a piece of wood in karate class last week.

## 2019-06-13 NOTE — ED Provider Notes (Signed)
[facility] EMERGENCY DEPARTMENT Provider Note   CSN: [id number] Arrival date & time: 06/13/19  0865     History Chief Complaint  Patient presents with  . Hand Pain    Mario Harvey is a 29 y.o.LHD male who presents with a right hand injury. He was doing karate on Thursday and punched a board. It immediately became very swollen and painful. He decided to wait to let it calm down and the swelling has improved but he is still having pain. Pain is primarily over the dorsal aspect of the hand over the 3rd, 4th, 5th metacarpals. He reports associated tingling. 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 He has taken Ibuprofen, Tylenol, and his fathers hydrocodone without relief.  HPI     Past Medical History:  Diagnosis Date  . Asthma     Patient Active Problem List   Diagnosis Date Noted  . ECZEMA, ATOPIC DERMATITIS 03/20/2006    Past Surgical History:  Procedure Laterality Date  . VASECTOMY         Family History  Problem Relation Age of Onset  . Diabetes Father     Social History   Tobacco Use  . Smoking status: Never Smoker  . Smokeless tobacco: Never Used  Substance Use Topics  . Alcohol use: Yes    Alcohol/week: 2.0 standard drinks    Types: 2 Glasses of wine per week  . Drug use: Never    Home Medications Prior to Admission medications   Not on File    Allergies    Patient has no known allergies.  Review of Systems   Review of Systems  Musculoskeletal: Positive for arthralgias and joint swelling.  Neurological: Negative for weakness and numbness.    Physical Exam Updated Vital Signs BP (!) 119/91 (BP Location: Left Arm)   Pulse 79   Temp 98.2 F (36.8 C) (Oral)   Resp 16   Ht 5\' 7"  (1.702 m)   Wt 70.3 kg   SpO2 99%   BMI 24.28 kg/m   Physical Exam Vitals and nursing note reviewed.  Constitutional:      General: He is not in acute distress.    Appearance: Normal appearance. He is well-developed. He is not  ill-appearing.  HENT:     Head: Normocephalic and atraumatic.  Eyes:     General: No scleral icterus.       Right eye: No discharge.        Left eye: No discharge.     Conjunctiva/sclera: Conjunctivae normal.     Pupils: Pupils are equal, round, and reactive to light.  Cardiovascular:     Rate and Rhythm: Normal rate.  Pulmonary:     Effort: Pulmonary effort is normal. No respiratory distress.  Abdominal:     General: There is no distension.  Musculoskeletal:     Cervical back: Normal range of motion.     Comments: Right hand: Mild diffuse swelling. Tenderness to palpation over 3rd, 4th, 5th knuckle. No deformity noted. FROM of fingers. Cap refill <2. N/V intact.   Skin:    General: Skin is warm and dry.  Neurological:     Mental Status: He is alert and oriented to person, place, and time.  Psychiatric:        Behavior: Behavior normal.     ED Results / Procedures / Treatments   Labs (all labs ordered are listed, but only abnormal results are displayed) Labs Reviewed - No data to display  EKG None  Radiology DG Hand Complete Right  Result Date: 06/13/2019 CLINICAL DATA:  Karate injury with hand pain, initial encounter EXAM: RIGHT HAND - COMPLETE 3+ VIEW COMPARISON:  None. FINDINGS: There is no evidence of fracture or dislocation. There is no evidence of arthropathy or other focal bone abnormality. Soft tissues are unremarkable. IMPRESSION: No acute abnormality noted. Electronically Signed   By: Alcide Clever M.D.   On: 06/13/2019 09:19    Procedures Procedures (including critical care time)  Medications Ordered in ED Medications - No data to display  ED Course  I have reviewed the triage vital signs and the nursing notes.  Pertinent labs & imaging results that were available during my care of the patient were reviewed by me and considered in my medical decision making (see chart for details).  29 year old who presents with R hand pain and swelling after punching a  board several days ago. Hand is diffusely swollen but he has good range of motion and does not appear to be in significant pain. Xray is negative. He was advised to rest, ice, elevate, use Tylenol/Ibuprofen and ACE wrap was provided.   MDM Rules/Calculators/A&P                       Final Clinical Impression(s) / ED Diagnoses Final diagnoses:  Hand sprain, right, initial encounter    Rx / DC Orders ED Discharge Orders    None       Bethel Born, PA-C 06/13/19 1107    Pricilla Loveless, MD 06/16/19 [phone]

## 2019-06-13 NOTE — Discharge Instructions (Signed)
Continue Tylenol and Ibuprofen for pain Ice - ice for 20 minutes at a time, several times a day Compression - wear brace to provide support Elevate - elevate the hand to help with swelling
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
## 2019-07-11 ENCOUNTER — Emergency Department (HOSPITAL_COMMUNITY): Payer: PRIVATE HEALTH INSURANCE

## 2019-07-11 ENCOUNTER — Other Ambulatory Visit: Payer: Self-pay

## 2019-07-11 ENCOUNTER — Emergency Department (HOSPITAL_COMMUNITY)
Admission: EM | Admit: 2019-07-11 | Discharge: 2019-07-11 | Discharge status: Against Medical Advice | Payer: PRIVATE HEALTH INSURANCE | Attending: Emergency Medicine | Admitting: Emergency Medicine

## 2019-07-11 DIAGNOSIS — Z041 Encounter for examination and observation following transport accident: Secondary | ICD-10-CM | POA: Insufficient documentation
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
## 2019-07-11 NOTE — ED Notes (Signed)
Patient declined imaging.

## 2020-12-26 IMAGING — CR DG HAND COMPLETE 3+V*R*
3 series · 3 of 3 positions shown · non-contrast
Comparison: None.

CLINICAL DATA: Karate injury with hand pain, initial encounter

EXAM:
RIGHT HAND - COMPLETE 3+ VIEW

[hand pa]
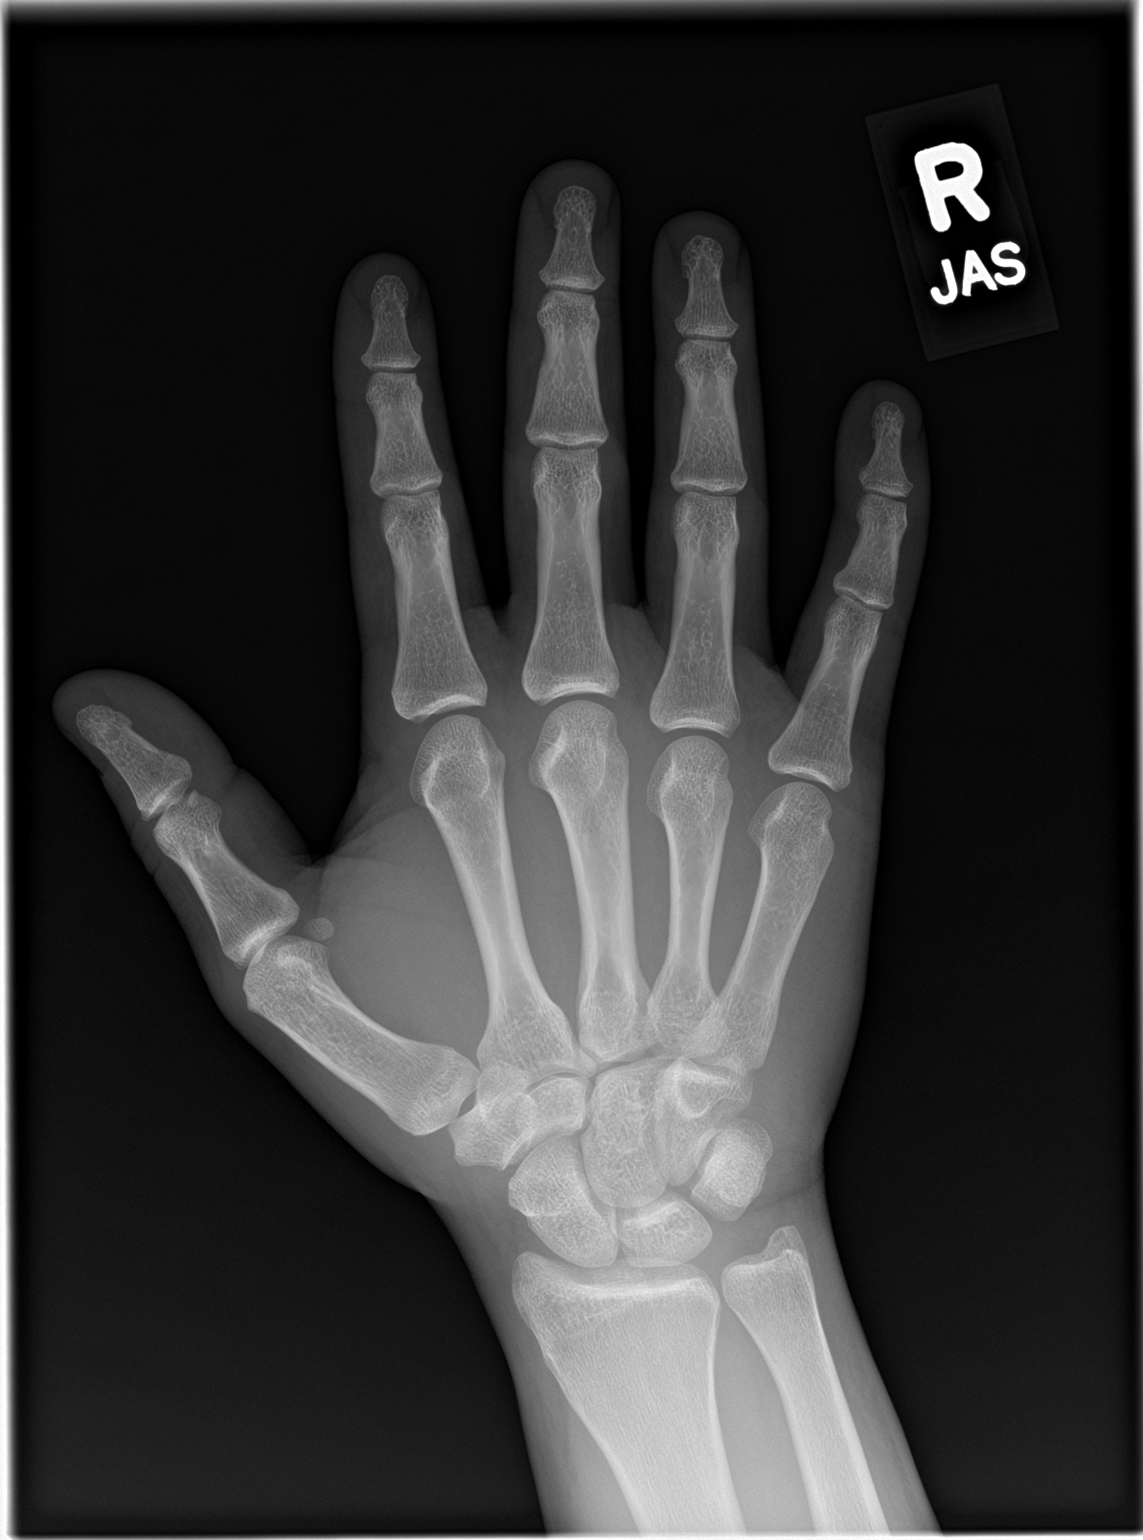

[hand obl]
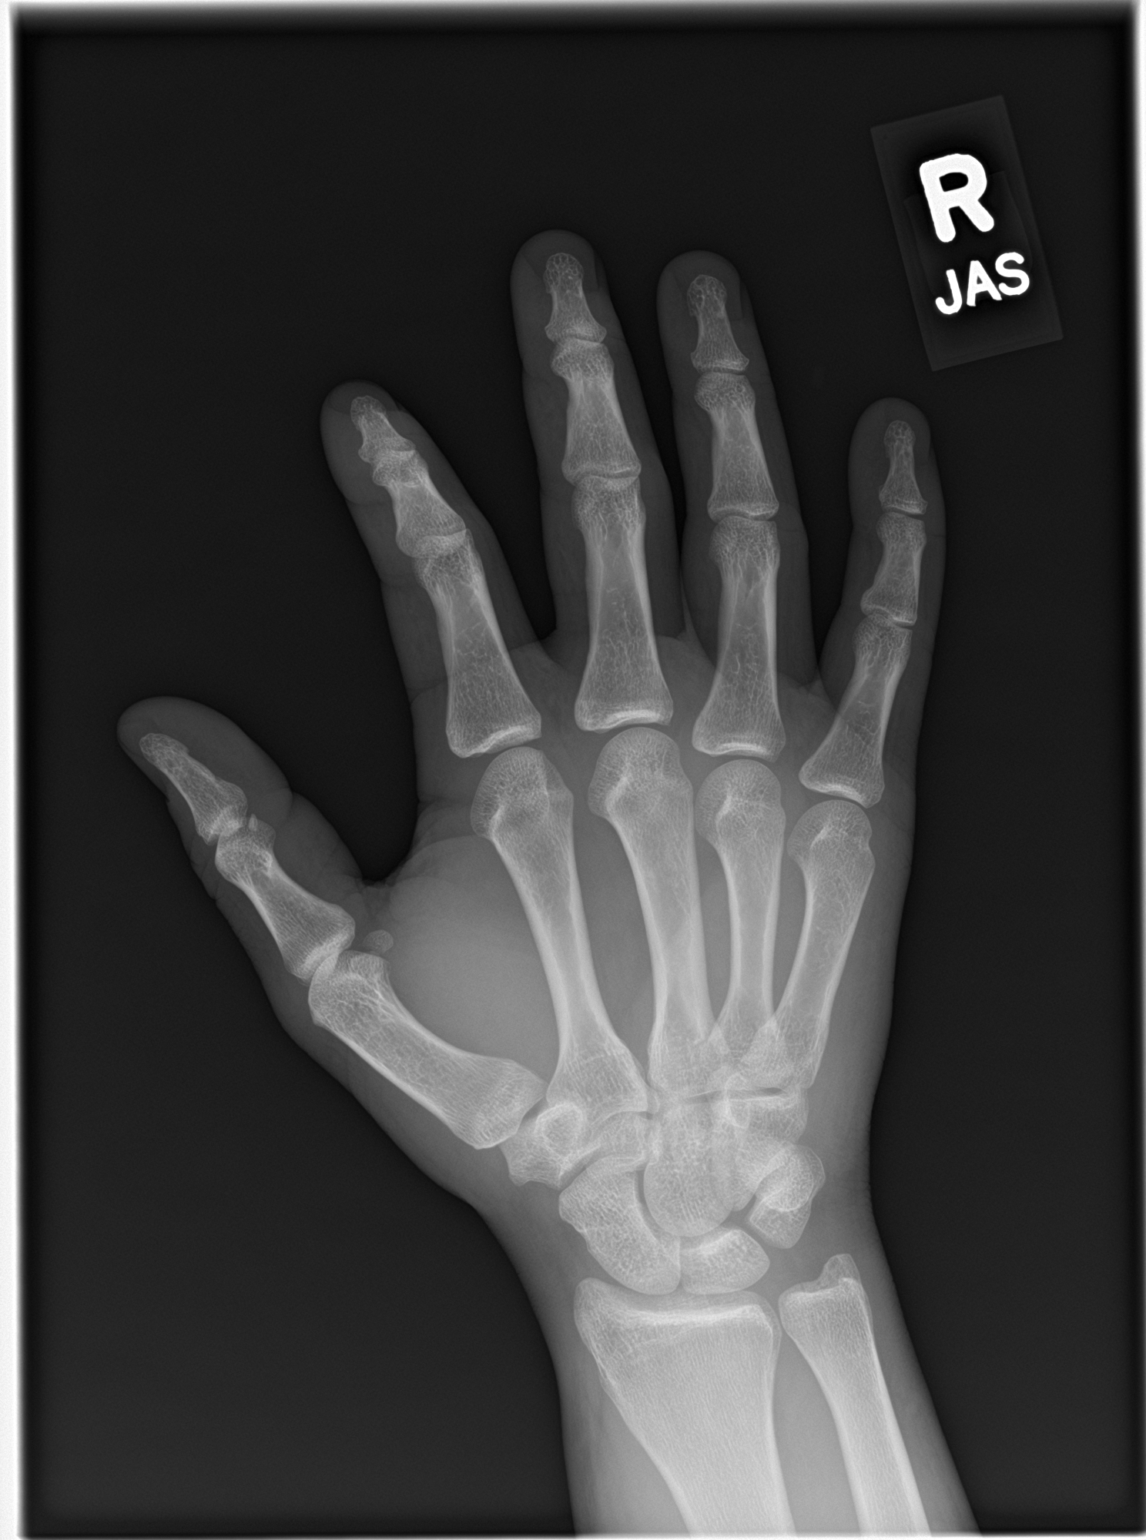

[hand lat]
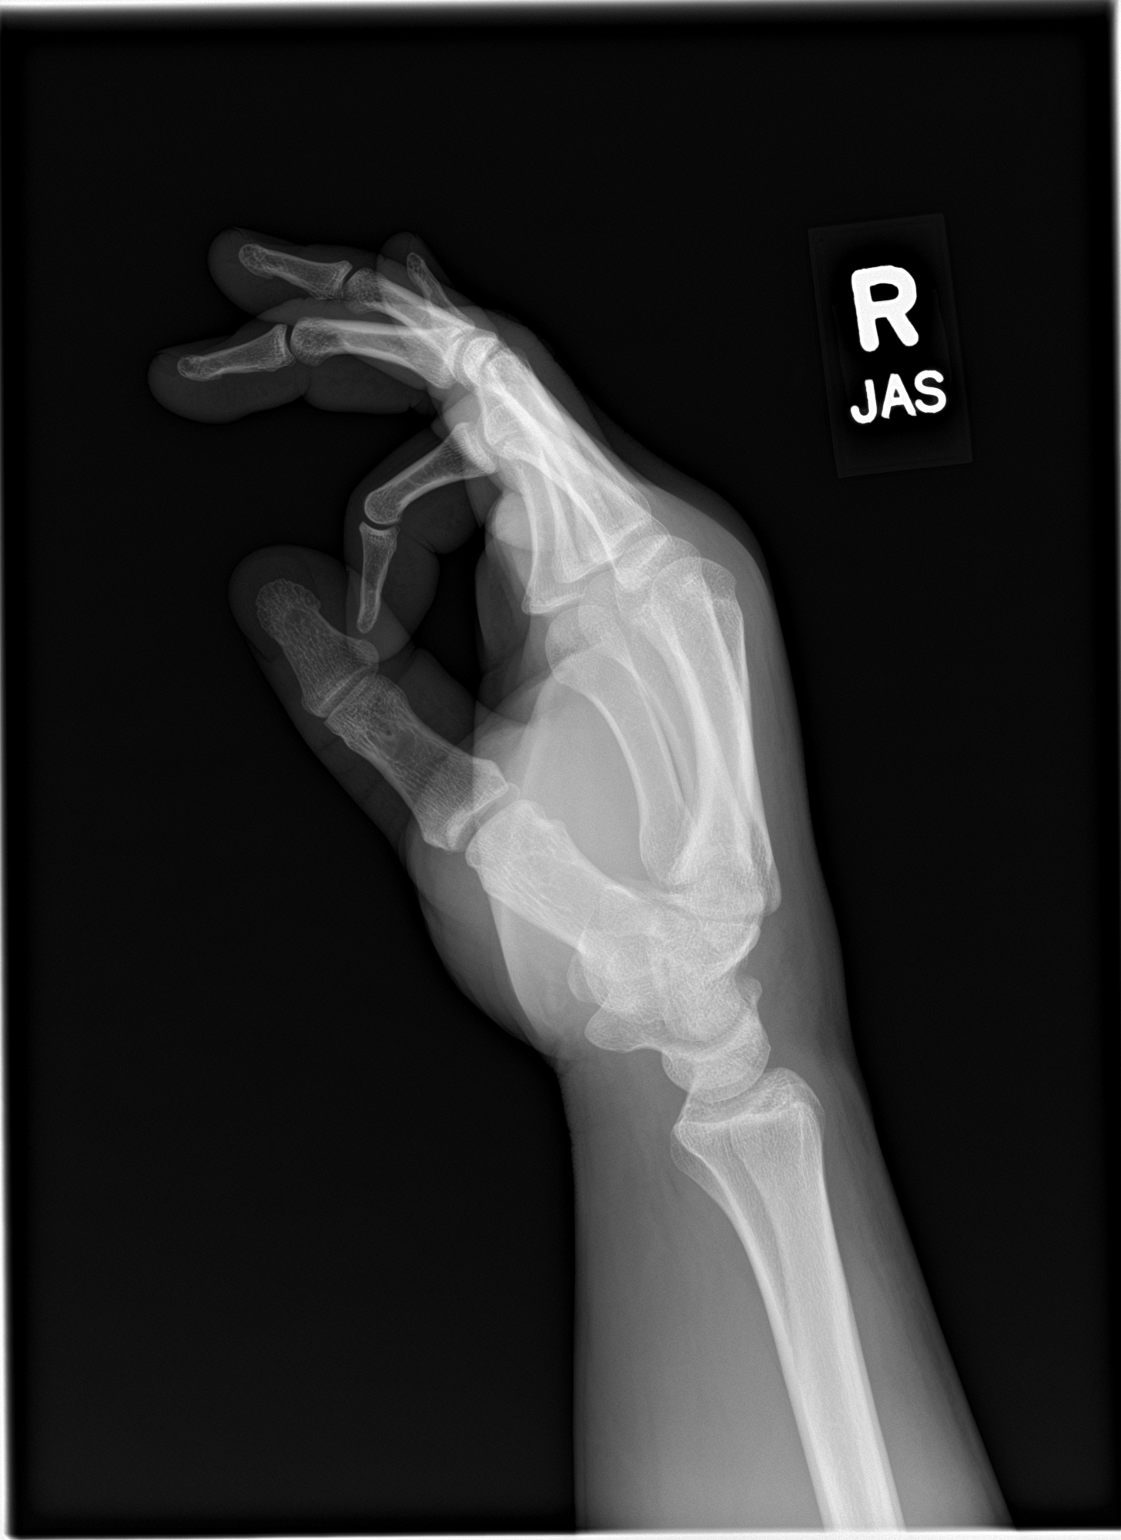

[3 of 3 positions shown; findings below may reference images not displayed]

FINDINGS: There is no evidence of fracture or dislocation. There is no
evidence of arthropathy or other focal bone abnormality. Soft
tissues are unremarkable.
IMPRESSION: No acute abnormality noted.
